# Patient Record
Sex: Male | Born: 1965 | Race: Black or African American | Hispanic: No | Smoking: Current every day smoker
Health system: Southern US, Community
[De-identification: ages and names within clinical notes are randomized; demographics above are authoritative.]

## PROBLEM LIST (undated history)

## (undated) DIAGNOSIS — E559 Vitamin D deficiency, unspecified: Secondary | ICD-10-CM

## (undated) DIAGNOSIS — J439 Emphysema, unspecified: Secondary | ICD-10-CM

## (undated) DIAGNOSIS — N400 Enlarged prostate without lower urinary tract symptoms: Secondary | ICD-10-CM

## (undated) DIAGNOSIS — Z77011 Contact with and (suspected) exposure to lead: Secondary | ICD-10-CM

## (undated) DIAGNOSIS — M5412 Radiculopathy, cervical region: Secondary | ICD-10-CM

## (undated) DIAGNOSIS — H9190 Unspecified hearing loss, unspecified ear: Secondary | ICD-10-CM

## (undated) DIAGNOSIS — J984 Other disorders of lung: Secondary | ICD-10-CM

## (undated) DIAGNOSIS — IMO0001 Reserved for inherently not codable concepts without codable children: Secondary | ICD-10-CM

## (undated) DIAGNOSIS — T7840XA Allergy, unspecified, initial encounter: Secondary | ICD-10-CM

## (undated) DIAGNOSIS — M5030 Other cervical disc degeneration, unspecified cervical region: Secondary | ICD-10-CM

## (undated) DIAGNOSIS — F17200 Nicotine dependence, unspecified, uncomplicated: Secondary | ICD-10-CM

## (undated) DIAGNOSIS — H6690 Otitis media, unspecified, unspecified ear: Secondary | ICD-10-CM

## (undated) HISTORY — DX: Contact with and (suspected) exposure to lead: Z77.011

## (undated) HISTORY — DX: Allergy, unspecified, initial encounter: T78.40XA

## (undated) HISTORY — DX: Vitamin D deficiency, unspecified: E55.9

## (undated) HISTORY — DX: Radiculopathy, cervical region: M54.12

## (undated) HISTORY — DX: Emphysema, unspecified: J43.9

## (undated) HISTORY — DX: Unspecified hearing loss, unspecified ear: H91.90

## (undated) HISTORY — DX: Otitis media, unspecified, unspecified ear: H66.90

## (undated) HISTORY — DX: Benign prostatic hyperplasia without lower urinary tract symptoms: N40.0

## (undated) HISTORY — DX: Other cervical disc degeneration, unspecified cervical region: M50.30

## (undated) HISTORY — DX: Other disorders of lung: J98.4

## (undated) HISTORY — PX: FOOT SURGERY: SHX648

## (undated) HISTORY — DX: Reserved for inherently not codable concepts without codable children: IMO0001

## (undated) HISTORY — DX: Nicotine dependence, unspecified, uncomplicated: F17.200

---

## 2004-05-13 ENCOUNTER — Encounter: Admission: RE | Admit: 2004-05-13 | Discharge: 2004-05-13 | Payer: Self-pay | Admitting: Internal Medicine

## 2005-05-21 ENCOUNTER — Ambulatory Visit (HOSPITAL_COMMUNITY): Admission: RE | Admit: 2005-05-21 | Discharge: 2005-05-21 | Payer: Self-pay | Admitting: Otolaryngology

## 2007-01-28 ENCOUNTER — Encounter: Admission: RE | Admit: 2007-01-28 | Discharge: 2007-01-28 | Payer: Self-pay | Admitting: Internal Medicine

## 2010-12-09 ENCOUNTER — Encounter: Payer: Self-pay | Admitting: Internal Medicine

## 2012-05-25 ENCOUNTER — Ambulatory Visit
Admission: RE | Admit: 2012-05-25 | Discharge: 2012-05-25 | Disposition: A | Discharge status: Home / Self Care | Payer: BC Managed Care – PPO | Source: Ambulatory Visit | Attending: Internal Medicine | Admitting: Internal Medicine

## 2012-05-25 ENCOUNTER — Other Ambulatory Visit: Payer: Self-pay | Admitting: Internal Medicine

## 2012-05-25 DIAGNOSIS — M542 Cervicalgia: Secondary | ICD-10-CM

## 2012-05-25 DIAGNOSIS — M79601 Pain in right arm: Secondary | ICD-10-CM

## 2012-06-29 ENCOUNTER — Other Ambulatory Visit: Payer: Self-pay | Admitting: Neurosurgery

## 2012-06-29 DIAGNOSIS — M5412 Radiculopathy, cervical region: Secondary | ICD-10-CM

## 2012-06-29 DIAGNOSIS — M47812 Spondylosis without myelopathy or radiculopathy, cervical region: Secondary | ICD-10-CM

## 2012-07-08 ENCOUNTER — Ambulatory Visit
Admission: RE | Admit: 2012-07-08 | Discharge: 2012-07-08 | Disposition: A | Discharge status: Home / Self Care | Payer: BC Managed Care – PPO | Source: Ambulatory Visit | Attending: Neurosurgery | Admitting: Neurosurgery

## 2012-07-08 VITALS — BP 110/75 | HR 76 | Ht 67.0 in | Wt 120.0 lb

## 2012-07-08 DIAGNOSIS — M47812 Spondylosis without myelopathy or radiculopathy, cervical region: Secondary | ICD-10-CM

## 2012-07-08 DIAGNOSIS — M5412 Radiculopathy, cervical region: Secondary | ICD-10-CM

## 2012-07-08 MED ORDER — IOHEXOL 300 MG/ML  SOLN
10.0000 mL | Freq: Once | INTRAMUSCULAR | Status: AC | PRN
  Administered 2012-07-08: 10 mL via INTRATHECAL

## 2012-07-08 MED ORDER — DIAZEPAM 5 MG PO TABS
10.0000 mg | ORAL_TABLET | Freq: Once | ORAL | Status: AC
  Administered 2012-07-08: 10 mg via ORAL

## 2012-07-19 ENCOUNTER — Ambulatory Visit
Admission: RE | Admit: 2012-07-19 | Discharge: 2012-07-19 | Disposition: A | Discharge status: Home / Self Care | Payer: BC Managed Care – PPO | Source: Ambulatory Visit | Attending: Internal Medicine | Admitting: Internal Medicine

## 2012-07-19 ENCOUNTER — Other Ambulatory Visit: Payer: Self-pay | Admitting: Internal Medicine

## 2012-09-09 ENCOUNTER — Encounter: Payer: Self-pay | Admitting: *Deleted

## 2012-09-09 DIAGNOSIS — H6690 Otitis media, unspecified, unspecified ear: Secondary | ICD-10-CM | POA: Insufficient documentation

## 2012-09-09 DIAGNOSIS — Z77011 Contact with and (suspected) exposure to lead: Secondary | ICD-10-CM | POA: Insufficient documentation

## 2012-09-09 DIAGNOSIS — M5412 Radiculopathy, cervical region: Secondary | ICD-10-CM | POA: Insufficient documentation

## 2012-09-09 DIAGNOSIS — H9190 Unspecified hearing loss, unspecified ear: Secondary | ICD-10-CM | POA: Insufficient documentation

## 2012-09-09 DIAGNOSIS — F17200 Nicotine dependence, unspecified, uncomplicated: Secondary | ICD-10-CM | POA: Insufficient documentation

## 2012-09-09 DIAGNOSIS — J984 Other disorders of lung: Secondary | ICD-10-CM | POA: Insufficient documentation

## 2012-09-09 DIAGNOSIS — T7840XA Allergy, unspecified, initial encounter: Secondary | ICD-10-CM | POA: Insufficient documentation

## 2014-03-30 ENCOUNTER — Ambulatory Visit (INDEPENDENT_AMBULATORY_CARE_PROVIDER_SITE_OTHER): Payer: BC Managed Care – PPO

## 2014-03-30 ENCOUNTER — Ambulatory Visit (INDEPENDENT_AMBULATORY_CARE_PROVIDER_SITE_OTHER): Payer: BC Managed Care – PPO | Admitting: Family Medicine

## 2014-03-30 VITALS — BP 106/66 | HR 88 | Temp 97.8°F | Resp 16 | Ht 66.0 in | Wt 114.4 lb

## 2014-03-30 DIAGNOSIS — M79675 Pain in left toe(s): Principal | ICD-10-CM

## 2014-03-30 DIAGNOSIS — M79609 Pain in unspecified limb: Secondary | ICD-10-CM

## 2014-03-30 DIAGNOSIS — M79674 Pain in right toe(s): Secondary | ICD-10-CM

## 2014-03-30 NOTE — Progress Notes (Signed)
[facility] [address], [place] [address] [phone]  Date:  03/30/2014   Name:  Jesse LakeHenry L Guthridge   DOB:  18-Feb-1966   MRN:  [id number]  PCP:  Willey BladeEAN, ERIC, MD    Chief Complaint: Feet Pain   History of Present Illness:  Jesse Wright is a 48 y.o. very pleasant male patient who presents with the following:  He is here today with "shooting pain" in his right great toe.  He has noted it for 6 months or more.  He wears steel toe boots and stands on concrete at his job so he blamed this for his sx.   He pain will come and go, but he notes it every day.   He does not have gout as far as he knows.   He has not had any injury to his feet He does notice that his big toe is crooked- this has been present for a long time   Patient Active Problem List   Diagnosis Date Noted  . Tobacco use disorder   . Hx of lead exposure   . Allergy   . Cervical radiculopathy   . Otitis media   . Hearing impairment   . Restrictive lung disease     Past Medical History  Diagnosis Date  . Allergy   . Unspecified vitamin D deficiency   . Hypertrophy of prostate without urinary obstruction and other lower urinary tract symptoms (LUTS)   . Emphysema   . Degenerative disc disease, cervical     with foraminal stenosis  . Hx of lead exposure     chronic  . Tobacco use disorder   . Cervical radiculopathy     right arm  . Otitis media   . Hearing impairment     left ear  . Restrictive lung disease     History reviewed. No pertinent past surgical history.  History  Substance Use Topics  . Smoking status: Current Every Day Smoker  . Smokeless tobacco: Not on file  . Alcohol Use: No    Family History  Problem Relation Age of Onset  . Hypertension Mother   . Hypertension Father   . Stroke Father     Allergies  Allergen Reactions  . Ketek [Telithromycin] Other (See Comments)    Patient doesn't recall ever taking this medication; has no idea reaction  . Penicillins  Other (See Comments)    "been so long ago...dizzy, lightheaded"    Medication list has been reviewed and updated.  Current Outpatient Prescriptions on File Prior to Visit  Medication Sig Dispense Refill  . cetirizine (ZYRTEC) 10 MG tablet Take 10 mg by mouth daily.       . cyclobenzaprine (FLEXERIL) 5 MG tablet Take 5 mg by mouth 3 (three) times daily as needed.      . flurbiprofen (ANSAID) 100 MG tablet Take 100 mg by mouth 2 (two) times daily.      . Hydrocodone-Acetaminophen 5-300 MG TABS Take 1 tablet by mouth as needed.       No current facility-administered medications on file prior to visit.    Review of Systems:  As per HPI- otherwise negative.   Physical Examination: Filed Vitals:   03/30/14 1327  BP: 106/66  Pulse: 88  Temp: 97.8 F (36.6 C)  Resp: 16   Filed Vitals:   03/30/14 1327  Height: 5\' 6"  (1.676 m)  Weight: 114 lb 6.4 oz (51.891 kg)   Body mass index is  18.47 kg/(m^2). Ideal Body Weight: Weight in (lb) to have BMI = 25: 154.6  GEN: WDWN, NAD, Non-toxic, A & O x 3, thin build HEENT: Atraumatic, Normocephalic. Neck supple. No masses, No LAD. Ears and Nose: No external deformity. CV: RRR, No M/G/R. No JVD. No thrill. No extra heart sounds. PULM: CTA B, no wheezes, crackles, rhonchi. No retractions. No resp. distress. No accessory muscle use. EXTR: No c/c/e NEURO Normal gait.  PSYCH: Normally interactive. Conversant. Not depressed or anxious appearing.  Calm demeanor.  Right foot: he has a large callus on the medial great toe.   Left foot: small callus on the medial great toe and on the sole of the foot at the 5th MT head.   No redness, swelling or other sx of gout.  Feet otherwise have normal shape  Pared down calluses for him- he tolerated well and felt more comfortable  [facility] reading (PRIMARY) by  Dr. Patsy Lager. Right foot: negative Left foot: negative   Assessment and Plan: Toe pain, bilateral - Plan: DG Foot 2 Views Right, DG Foot 2 Views  Left, Ambulatory referral to Podiatry  Pared calluses as above.  Will refer to podiatry for further evaluation.  Suspect his pain is due to large calluses and shoe fit issues.    Signed Abbe Amsterdam, MD

## 2014-03-30 NOTE — Patient Instructions (Signed)
We are going to refer you to podiatry to look at your feet in more detail.  Use tylenol or ibuprofen as needed for pain.   Take you CD (with your x-rays) to the podiatrist appointment.  Let us know if you need anything in the meantime

## 2014-04-05 ENCOUNTER — Ambulatory Visit (INDEPENDENT_AMBULATORY_CARE_PROVIDER_SITE_OTHER): Payer: BC Managed Care – PPO | Admitting: Podiatry

## 2014-04-05 ENCOUNTER — Encounter: Payer: Self-pay | Admitting: Podiatry

## 2014-04-05 VITALS — BP 107/60 | HR 74 | Resp 14 | Ht 67.0 in | Wt 115.0 lb

## 2014-04-05 DIAGNOSIS — L988 Other specified disorders of the skin and subcutaneous tissue: Secondary | ICD-10-CM

## 2014-04-05 DIAGNOSIS — Q828 Other specified congenital malformations of skin: Secondary | ICD-10-CM

## 2014-04-05 NOTE — Progress Notes (Signed)
   Subjective:    Patient ID: Jesse Wright, male    DOB: Dec 24, 1965, 48 y.o.   MRN: [id number]  HPI Comments: Pt states has shooting pains through both 1st toes, and right 5th MPJ. Patient states that the pain has been ongoing for over one year. He states that the pain is mostly when wearing steel toed shoes and standing on concrete for a period of time at work. The pain decreases when wearing a sneaker. He states that the pain has been increasing over the course of several months. He was seen at the urgent care center where the callus were pared down. He states that the calluses have never broken down or developed into a wound. No other complaints at this time.   Toe Pain       Review of Systems  HENT: Positive for sinus pressure.   Musculoskeletal: Positive for arthralgias.  Skin:       calluses  All other systems reviewed and are negative.      Objective:   Physical Exam  Nursing note and vitals reviewed. Constitutional: He is oriented to person, place, and time.  Musculoskeletal: Normal range of motion. He exhibits no edema.  Mild tenderness to palpation over the medial hallux bilaterally and right submetatarsal 5 head.  Decrease in the medial arch height bilaterally upon weightbearing.  Neurological: He is alert and oriented to person, place, and time.  Protective sensation intact.  Skin:  Hyperkeratotic lesions bilateral medial hallux at the level of the IPJ R>L. Hyperkeratotic lesion submetatarsal 5 right. No open lesions. No clinical signs of infection.  DP/PT pulses palpable b/l. CRT <3sec.         Assessment & Plan:  48 year old male bilateral hallux IPJ, right submetatarsal 5 painful hyperkeratotic lesions with associated pes planus. -X-rays are reviewed. There does appear to be exostosis off of the medial aspect of the hallux medially corresponding to the area of the hyperkeratotic lesions. Conservative versus surgical options discussed with the patient in  detail. -Hyperkeratotic lesions sharply debrided without complication to patient comfort.  -Foam pads dispensed to the patient as well as a pad for the right hyperkeratotic lesion. -Discussed custom versus OTC orthotics to help control the pes planus as well as to help take the pressure off the painful areas. Patient states that he gets orthotics from his company. -Patient to continue care conservative treatment at this time and will followup as needed.

## 2015-03-13 ENCOUNTER — Ambulatory Visit (INDEPENDENT_AMBULATORY_CARE_PROVIDER_SITE_OTHER): Payer: BLUE CROSS/BLUE SHIELD | Admitting: Family Medicine

## 2015-03-13 VITALS — BP 118/70 | HR 76 | Temp 98.7°F | Resp 16 | Ht 67.0 in | Wt 120.0 lb

## 2015-03-13 DIAGNOSIS — R42 Dizziness and giddiness: Secondary | ICD-10-CM

## 2015-03-13 DIAGNOSIS — H9312 Tinnitus, left ear: Secondary | ICD-10-CM

## 2015-03-13 DIAGNOSIS — J0100 Acute maxillary sinusitis, unspecified: Secondary | ICD-10-CM

## 2015-03-13 DIAGNOSIS — H8102 Meniere's disease, left ear: Secondary | ICD-10-CM | POA: Diagnosis not present

## 2015-03-13 MED ORDER — MECLIZINE HCL 25 MG PO TABS: 25.0000 mg | ORAL_TABLET | Freq: Three times a day (TID) | ORAL | Status: DC | PRN

## 2015-03-13 MED ORDER — LEVOFLOXACIN 500 MG PO TABS: 500.0000 mg | ORAL_TABLET | Freq: Every day | ORAL | Status: DC

## 2015-03-13 NOTE — Progress Notes (Signed)
° °  Subjective:  This chart was scribed for Elvina SidleKurt Lauenstein, MD by Broadus Johnawaa Al Rifaie, Medical Scribe. This patient was seen in Room 8 and the patient's care was started at 10:05 AM.   Patient ID: Jesse Wright, male    DOB: 01-27-1966, 49 y.o.   MRN: [id number]  HPI HPI Comments: Jesse Wright is a 49 y.o. male who [facility] complaining of  Dizziness, gradual onset yesterday.  Pt notes associates symptoms of vomiting, light headedness, nausea, sinus pressure, tinnitus, and ear ache. Pt states that the symptoms are more severe this morning than they were yesterday. Pt reports that he has taken some medications for the nausea, however he does not recall the name. He reports that he has experienced similar symptoms previously, and they usually are more severe during this time of the year. Pt states that he is compliant with his zyrtec medication. Pt denies dental problems.  Pt was diagnosed with Meniere's disease about 3 years ago which resulted in a loss of hearing in the left ear. Pt had multiple tests done for it. He notes that he gets his ears checked once every year, and there are no new finding with his condition.    PCP is Dr. August Saucerean  Pt works at [employer] in [place]   Review of Systems  HENT: Positive for ear pain, hearing loss, sinus pressure and tinnitus. Negative for dental problem.   Gastrointestinal: Positive for nausea and vomiting.  Neurological: Positive for light-headedness.       Objective:   Physical Exam  Constitutional: He is oriented to person, place, and time. He appears well-developed and well-nourished. No distress.  HENT:  Head: Normocephalic and atraumatic.  Right Ear: External ear normal.  Left Ear: External ear normal.  Partial upper denture  Multiple dental caries  Eyes: EOM are normal. Pupils are equal, round, and reactive to light.  Neck: Neck supple.  Cardiovascular: Normal rate, regular rhythm and normal heart sounds.   Exam reveals no gallop and no friction rub.   No murmur heard. Pulmonary/Chest: Effort normal and breath sounds normal. No respiratory distress. He has no wheezes. He has no rales.  Musculoskeletal:  No bruise in neck   Neurological: He is alert and oriented to person, place, and time. No cranial nerve deficit.  Skin: Skin is warm and dry.  Psychiatric: He has a normal mood and affect. His behavior is normal.  Nursing note and vitals reviewed.  BP 118/70 mmHg   Pulse 76   Temp(Src) 98.7 F (37.1 C) (Oral)   Resp 16   Ht 5\' 7"  (1.702 m)   Wt 120 lb (54.432 kg)   BMI 18.79 kg/m2   SpO2 98%     Assessment & Plan:   This chart was scribed in my presence and reviewed by me personally.    ICD-9-CM ICD-10-CM   1. Vertigo 780.4 R42 meclizine (ANTIVERT) 25 MG tablet  2. Tinnitus, left 388.30 H93.12 meclizine (ANTIVERT) 25 MG tablet  3. Meniere disease, left 386.00 H81.02 meclizine (ANTIVERT) 25 MG tablet  4. Subacute maxillary sinusitis 461.0 J01.00 levofloxacin (LEVAQUIN) 500 MG tablet     Signed, Elvina SidleKurt Lauenstein, MD

## 2015-03-13 NOTE — Patient Instructions (Signed)
I would like you to follow-up with your primary care physician, Dr. Willey BladeEric Dean, as soon as he can get you in to his schedule  Meniere Disease Meniere disease is an inner ear disorder. It causes attacks of a spinning sensation (vertigo) and ringing in the ear (tinnitus). It also causes hearing loss and a sensation of fullness or pressure in your ear.  Meniere disease is lifelong. It may get worse over time. Symptoms usually begin in one ear but may eventually affect both ears.  CAUSES Meniere disease is caused by having too much of the fluid that is in your inner ear (endolymph). When endolymph builds up in your inner ear, it affects the nerves that control balance and hearing. The reason for the endolymph buildup is not known. Possible causes include:  Allergy.  An abnormal reaction of the body's defense system (autoimmune disease).  Viral infection of the inner ear.  Head injury. RISK FACTORS  Age older than 40 years.  Family history of Meniere disease.  History of autoimmune disease.  History of migraine headaches. SIGNS AND SYMPTOMS Symptoms of Meniere disease can come and go and may last for up to 4 hours at a time. Symptoms usually start in one ear and may become more frequent and eventually involve both ears. Symptoms can include:  Fullness and pressure in your ear.  Roaring or ringing in your ear.  Vertigo and loss of balance.  Decreased hearing.  Nausea and vomiting. DIAGNOSIS Your health care provider will perform a physical exam. Tests may be done to confirm a diagnosis of Meniere disease. These tests may include:  A hearing test (audiogram).  An electronystagmogram. This tests your balance nerve (vestibular nerve).  Imaging studies, such as CT or MRI, of your inner ear. TREATMENT There is no cure for Meniere disease, but it can be managed. Management may include:  A diet that may help relieve symptoms of Meniere disease.  Use of medicines to  reduce:  Vertigo.  Nausea.  Fluid retention.  Use of an air pressure pulse generator. This is a machine that sends small pressure pulses into your ear canal.  Inner ear surgery (rare). When you experience symptoms, it can be helpful to lie down on a flat surface and focus your eyes on one object that does not move. Try to stay in that position until your symptoms go away.  HOME CARE INSTRUCTIONS   Take medicines only as directed by your health care provider.  Eat the same amount of food at the same time every day, including snacks.  Do not skip meals.  Limit the salt in your diet to 1,000 mg a day.  Avoid caffeine.  Limit alcoholic drinks to one drink a day.  Do not eat foods containing monosodium glutamate (MSG).  Drink enough fluids to keep your urine clear or pale yellow.  Do not use any tobacco products including cigarettes, chewing tobacco, or electronic cigarettes. If you need help quitting, ask your health care provider.  Find ways to reduce or avoid stress. SEEK MEDICAL CARE IF:   You have symptoms that last longer than 4 hours.  You have new or more severe symptoms. SEEK IMMEDIATE MEDICAL CARE IF:   You have been vomiting for 24 hours.  You are not able to keep fluids down.  You have chest pain or trouble breathing. Document Released: 08/08/2000 Document Revised: 12/26/2013 Document Reviewed: 07/25/2013 [facility] Patient Information 2015 [employer] [place]. This information is not intended to replace advice given to you  by your health care provider. Make sure you discuss any questions you have with your health care provider.  

## 2015-11-09 ENCOUNTER — Emergency Department (HOSPITAL_COMMUNITY)
Admission: EM | Admit: 2015-11-09 | Discharge: 2015-11-09 | Disposition: A | Discharge status: Home / Self Care | Payer: BLUE CROSS/BLUE SHIELD | Attending: Emergency Medicine | Admitting: Emergency Medicine

## 2015-11-09 ENCOUNTER — Encounter (HOSPITAL_COMMUNITY): Payer: Self-pay | Admitting: *Deleted

## 2015-11-09 ENCOUNTER — Emergency Department (HOSPITAL_COMMUNITY): Payer: BLUE CROSS/BLUE SHIELD

## 2015-11-09 DIAGNOSIS — Z8709 Personal history of other diseases of the respiratory system: Secondary | ICD-10-CM | POA: Insufficient documentation

## 2015-11-09 DIAGNOSIS — Z8639 Personal history of other endocrine, nutritional and metabolic disease: Secondary | ICD-10-CM | POA: Insufficient documentation

## 2015-11-09 DIAGNOSIS — F17200 Nicotine dependence, unspecified, uncomplicated: Secondary | ICD-10-CM | POA: Diagnosis not present

## 2015-11-09 DIAGNOSIS — Z880 Allergy status to penicillin: Secondary | ICD-10-CM | POA: Diagnosis not present

## 2015-11-09 DIAGNOSIS — Z8739 Personal history of other diseases of the musculoskeletal system and connective tissue: Secondary | ICD-10-CM | POA: Diagnosis not present

## 2015-11-09 DIAGNOSIS — N23 Unspecified renal colic: Secondary | ICD-10-CM

## 2015-11-09 DIAGNOSIS — R109 Unspecified abdominal pain: Secondary | ICD-10-CM | POA: Diagnosis present

## 2015-11-09 LAB — COMPREHENSIVE METABOLIC PANEL
ALK PHOS: 63 U/L (ref 38–126)
ALT: 16 U/L — AB (ref 17–63)
AST: 22 U/L (ref 15–41)
Albumin: 3.4 g/dL — ABNORMAL LOW (ref 3.5–5.0)
Anion gap: 11 (ref 5–15)
BUN: <5 mg/dL — ABNORMAL LOW (ref 6–20)
CALCIUM: 8.7 mg/dL — AB (ref 8.9–10.3)
CO2: 25 mmol/L (ref 22–32)
CREATININE: 0.86 mg/dL (ref 0.61–1.24)
Chloride: 106 mmol/L (ref 101–111)
Glucose, Bld: 115 mg/dL — ABNORMAL HIGH (ref 65–99)
Potassium: 3.5 mmol/L (ref 3.5–5.1)
Sodium: 142 mmol/L (ref 135–145)
Total Bilirubin: 0.3 mg/dL (ref 0.3–1.2)
Total Protein: 6.1 g/dL — ABNORMAL LOW (ref 6.5–8.1)

## 2015-11-09 LAB — CBC
HCT: 39.3 % (ref 39.0–52.0)
Hemoglobin: 13.3 g/dL (ref 13.0–17.0)
MCH: 29 pg (ref 26.0–34.0)
MCHC: 33.8 g/dL (ref 30.0–36.0)
MCV: 85.6 fL (ref 78.0–100.0)
Platelets: 404 10*3/uL — ABNORMAL HIGH (ref 150–400)
RBC: 4.59 MIL/uL (ref 4.22–5.81)
RDW: 13.5 % (ref 11.5–15.5)
WBC: 8.4 10*3/uL (ref 4.0–10.5)

## 2015-11-09 LAB — URINE MICROSCOPIC-ADD ON

## 2015-11-09 LAB — URINALYSIS, ROUTINE W REFLEX MICROSCOPIC
BILIRUBIN URINE: NEGATIVE
GLUCOSE, UA: NEGATIVE mg/dL
KETONES UR: NEGATIVE mg/dL
Leukocytes, UA: NEGATIVE
NITRITE: NEGATIVE
PH: 7.5 (ref 5.0–8.0)
Protein, ur: NEGATIVE mg/dL
SPECIFIC GRAVITY, URINE: 1.012 (ref 1.005–1.030)

## 2015-11-09 LAB — LIPASE, BLOOD: LIPASE: 22 U/L (ref 11–51)

## 2015-11-09 MED ORDER — MORPHINE SULFATE (PF) 4 MG/ML IV SOLN
4.0000 mg | Freq: Once | INTRAVENOUS | Status: DC
  Filled 2015-11-09: qty 1

## 2015-11-09 MED ORDER — ONDANSETRON HCL 4 MG/2ML IJ SOLN
4.0000 mg | Freq: Once | INTRAMUSCULAR | Status: DC
  Filled 2015-11-09: qty 2

## 2015-11-09 MED ORDER — KETOROLAC TROMETHAMINE 30 MG/ML IJ SOLN
30.0000 mg | Freq: Once | INTRAMUSCULAR | Status: DC
  Filled 2015-11-09: qty 1

## 2015-11-09 NOTE — ED Provider Notes (Signed)
CSN: [id number]     Arrival date & time 11/09/15  1006 History   First MD Initiated Contact with Patient 11/09/15 1549     Chief Complaint  Patient presents with  . Abdominal Pain     (Consider location/radiation/quality/duration/timing/severity/associated sxs/prior Treatment) HPI Patient presents with acute onset right flank pain radiating into the right side of his abdomen. This occurred while trying to have a bowel movement. States he had mild amount of nausea. No vomiting. No previously similar symptoms. States the pain has significantly improved since onset. Denies fever or chills. No urinary symptoms including gross hematuria or dysuria. No history of previous kidney stones.  Past Medical History  Diagnosis Date  . Allergy   . Unspecified vitamin D deficiency   . Hypertrophy of prostate without urinary obstruction and other lower urinary tract symptoms (LUTS)   . Emphysema   . Degenerative disc disease, cervical     with foraminal stenosis  . Hx of lead exposure     chronic  . Tobacco use disorder   . Cervical radiculopathy     right arm  . Otitis media   . Hearing impairment     left ear  . Restrictive lung disease    History reviewed. No pertinent past surgical history. Family History  Problem Relation Age of Onset  . Hypertension Mother   . Hypertension Father   . Stroke Father    Social History  Substance Use Topics  . Smoking status: Current Every Day Smoker  . Smokeless tobacco: None  . Alcohol Use: No    Review of Systems  Constitutional: Negative for fever and chills.  Respiratory: Negative for shortness of breath.   Cardiovascular: Negative for chest pain.  Gastrointestinal: Positive for nausea and abdominal pain. Negative for vomiting, diarrhea and constipation.  Genitourinary: Positive for flank pain. Negative for hematuria.  Musculoskeletal: Negative for myalgias, back pain, neck pain and neck stiffness.  Skin: Negative for rash and wound.   Neurological: Negative for dizziness, weakness, light-headedness, numbness and headaches.  All other systems reviewed and are negative.     Allergies  Ketek and Penicillins  Home Medications   Prior to Admission medications   Medication Sig Start Date End Date Taking? Authorizing Provider  guaiFENesin (MUCINEX) 600 MG 12 hr tablet Take 600 mg by mouth 2 (two) times daily as needed for cough or to loosen phlegm.   Yes Historical Provider, MD  ibuprofen (ADVIL,MOTRIN) 800 MG tablet Take 400 mg by mouth every 8 (eight) hours as needed for moderate pain.   Yes Historical Provider, MD  levofloxacin (LEVAQUIN) 500 MG tablet Take 1 tablet (500 mg total) by mouth daily. Patient not taking: Reported on 11/09/2015 03/13/15   Elvina Sidle, MD  meclizine (ANTIVERT) 25 MG tablet Take 1 tablet (25 mg total) by mouth 3 (three) times daily as needed for dizziness. Patient not taking: Reported on 11/09/2015 03/13/15   Elvina Sidle, MD   BP 121/68 mmHg  Pulse 56  Temp(Src) 97.7 F (36.5 C) (Oral)  Resp 18  SpO2 100% Physical Exam  Constitutional: He is oriented to person, place, and time. He appears well-developed and well-nourished. No distress.  HENT:  Head: Normocephalic and atraumatic.  Mouth/Throat: Oropharynx is clear and moist.  Eyes: EOM are normal. Pupils are equal, round, and reactive to light.  Neck: Normal range of motion. Neck supple.  Cardiovascular: Normal rate and regular rhythm.   Pulmonary/Chest: Effort normal and breath sounds normal. No respiratory distress. He has no  wheezes. He has no rales. He exhibits no tenderness.  Abdominal: Soft. Bowel sounds are normal. He exhibits no distension and no mass. There is no tenderness. There is no rebound and no guarding.  Musculoskeletal: Normal range of motion. He exhibits no edema or tenderness.  No CVA tenderness with percussion.  Neurological: He is alert and oriented to person, place, and time.  Skin: Skin is warm and dry. No  rash noted. No erythema.  Psychiatric: He has a normal mood and affect. His behavior is normal.  Nursing note and vitals reviewed.   ED Course  Procedures (including critical care time) Labs Review Labs Reviewed  COMPREHENSIVE METABOLIC PANEL - Abnormal; Notable for the following:    Glucose, Bld 115 (*)    BUN <5 (*)    Calcium 8.7 (*)    Total Protein 6.1 (*)    Albumin 3.4 (*)    ALT 16 (*)    All other components within normal limits  CBC - Abnormal; Notable for the following:    Platelets 404 (*)    All other components within normal limits  URINALYSIS, ROUTINE W REFLEX MICROSCOPIC (NOT AT [facility]) - Abnormal; Notable for the following:    Hgb urine dipstick MODERATE (*)    All other components within normal limits  URINE MICROSCOPIC-ADD ON - Abnormal; Notable for the following:    Squamous Epithelial / LPF 0-5 (*)    Bacteria, UA RARE (*)    Casts GRANULAR CAST (*)    All other components within normal limits  LIPASE, BLOOD    Imaging Review Ct Renal Stone Study  11/09/2015  CLINICAL DATA:  Right flank pain after a bowel movement this morning. EXAM: CT ABDOMEN AND PELVIS WITHOUT CONTRAST TECHNIQUE: Multidetector CT imaging of the abdomen and pelvis was performed following the standard protocol without IV contrast. COMPARISON:  None. FINDINGS: Lower chest:  Lung bases are clear.  No pleural effusions. Hepatobiliary: Normal appearance of the liver and gallbladder. Pancreas: Normal appearance of the pancreas without inflammation or duct dilatation. Spleen: Normal appearance of spleen without enlargement. Adrenals/Urinary Tract: 1.4 cm low-density nodule in the right adrenal gland is suggestive for a benign adenoma. Normal left adrenal gland. Normal appearance of the left kidney without stones. There is a 3 mm stone in the right kidney lower pole without hydronephrosis. No evidence for a ureter stone. Normal appearance of the urinary bladder. Stomach/Bowel: Appendix is partially  visualized in the right lower quadrant without inflammatory changes. No acute abnormality to the stomach, small bowel or colon. No evidence for bowel obstruction. Vascular/Lymphatic: No significant lymphadenopathy. Negative for an aortic aneurysm. Reproductive: Prostate is prominent measuring 5.0 cm in transverse dimension Other: No free fluid.  No free air. Musculoskeletal:  No suspicious bone lesions identified. IMPRESSION: Nonobstructive right kidney stone. Right adrenal adenoma. Prominent prostate. Electronically Signed   By: Richarda OverlieAdam  Henn M.D.   On: 11/09/2015 17:41   I have personally reviewed and evaluated these images and lab results as part of my medical decision-making.   EKG Interpretation None      MDM   Final diagnoses:  Renal colic on right side    Patient very well-appearing. States the pain is significantly improved. His refusing further pain medication. CT renal stone study given hematuria on UA. CT with nonobstructive right renal stone. Question previously passing stone. Continues to be asymptomatic. We'll discharge home with return precautions.  Loren Raceravid Lun Muro, MD 11/09/15 1806

## 2015-11-09 NOTE — ED Notes (Signed)
Pt reports having bowel movement this am and then onset of right side abd/flank pain. Denies difficulty urinating. No acute distress noted at triage.

## 2015-11-09 NOTE — Discharge Instructions (Signed)
Kidney Stones  Kidney stones (urolithiasis) are solid masses that form inside your kidneys. The intense pain is caused by the stone moving through the kidney, ureter, bladder, and urethra (urinary tract). When the stone moves, the ureter starts to spasm around the stone. The stone is usually passed in your pee (urine).   HOME CARE  · Drink enough fluids to keep your pee clear or pale yellow. This helps to get the stone out.  · Take a 24-hour pee (urine) sample as told by your doctor. You may need to take another sample every 6-12 months.  · Strain all pee through the provided strainer. Do not pee without peeing through the strainer, not even once. If you pee the stone out, catch it in the strainer. The stone may be as small as a grain of salt. Take this to your doctor. This will help your doctor figure out what you can do to try to prevent more kidney stones.  · Only take medicine as told by your doctor.  · Make changes to your daily diet as told by your doctor. You may be told to:    Limit how much salt you eat.    Eat 5 or more servings of fruits and vegetables each day.    Limit how much meat, poultry, fish, and eggs you eat.  · Keep all follow-up visits as told by your doctor. This is important.  · Get follow-up X-rays as told by your doctor.  GET HELP IF:  You have pain that gets worse even if you have been taking pain medicine.  GET HELP RIGHT AWAY IF:   · Your pain does not get better with medicine.  · You have a fever or shaking chills.  · Your pain increases and gets worse over 18 hours.  · You have new belly (abdominal) pain.  · You feel faint or pass out.  · You are unable to pee.     This information is not intended to replace advice given to you by your health care provider. Make sure you discuss any questions you have with your health care provider.     Document Released: 01/28/2008 Document Revised: 05/02/2015 Document Reviewed: 01/12/2013  Elsevier Interactive Patient Education ©2016 [employer]

## 2016-02-12 DIAGNOSIS — I824Z1 Acute embolism and thrombosis of unspecified deep veins of right distal lower extremity: Secondary | ICD-10-CM | POA: Insufficient documentation

## 2016-03-06 DIAGNOSIS — Z7901 Long term (current) use of anticoagulants: Secondary | ICD-10-CM | POA: Insufficient documentation

## 2016-05-29 DIAGNOSIS — S92901A Unspecified fracture of right foot, initial encounter for closed fracture: Secondary | ICD-10-CM | POA: Insufficient documentation

## 2016-11-04 ENCOUNTER — Encounter (INDEPENDENT_AMBULATORY_CARE_PROVIDER_SITE_OTHER): Payer: Self-pay

## 2016-11-04 ENCOUNTER — Encounter (HOSPITAL_COMMUNITY): Payer: Self-pay | Admitting: Psychology

## 2016-11-04 ENCOUNTER — Ambulatory Visit (INDEPENDENT_AMBULATORY_CARE_PROVIDER_SITE_OTHER): Payer: BLUE CROSS/BLUE SHIELD | Admitting: Psychology

## 2016-11-04 DIAGNOSIS — F1210 Cannabis abuse, uncomplicated: Secondary | ICD-10-CM | POA: Diagnosis not present

## 2016-11-04 NOTE — Progress Notes (Signed)
CCA: The patient is a 50 yo married, black, male seeking to complete an assessment per instructions from his employer. The patient was injured on the job on Jan 23, 2016. He suffered a severe injury to his right foot and all five toes and part of his foot were crushed. He has had surgeries and nine months after the injury, he continues to receive treatment from orthopedic and other specialists in order to regain ability to walk without cane, crutches or boot. The patient reported he smoked a little cannabis in his youth, but he has been employed by [employer] for 22 years. His job description includes constructing batteries and because of the potential problems associated with toxic chemicals, etc., the patient has his blood drawn monthly. He decided when he was first hired, he would have to give up any illegal drug use or risk losing his job. The patient reported he remained abstinent for years, but in the spring of 2017, his father became quite ill. His oldest son, who is a quadriplegic (paralyzed 10 years ago due to a gunshot), suffered two serious setback at the same time and it proved to be a very stressful time for him. The patient reported he began smoking a couple of times a week. On the job over the [employer], a holiday where he would get almost triple his regular pay, the patient was seeking more work and used a machine that he had little experience with. The forklift crushed his right foot. His father died the week after his accident. The patient has had surgeries and continues to receive treatment. He met me today in a boot and walks with a limp. He admitted he still hurts and is going for physical therapy in an effort to allow him to put his foot in a shoe that is being specially made for him. The patient reported his work wants him back, but have required him to complete a SA assessment.  The patient reportred he and his wife have continued to struggle financially because he  hadn't received a pay check until January of this year, when he was approved for [employer]. Prior to this, they had lived off her small income and came close to 'losing everything'. The patient reported he last smoked marijuana in early January. He has remained abstinent and intends to remain that way. He also understands his employer will be conducting regular drug tests. It appears as if in the late spring of 2017 this patient struggled with significant stress and angst due to his father's failing health and severe health complications suffered by his 53 yo quadriplegic son ("We almost lost him twice"). These stressors were more than he could manage and contributed to his sporadic use of cannabis. He was injured on work and a blood test was positive for THC. He has been out of work since the injury and is still unable to work at this time. However, he is preparing to return and his employer wants him back. Our recommendation is that this patient be randomly drug-tested and close monitoring continued indefinitely.

## 2017-03-17 DIAGNOSIS — S92321K Displaced fracture of second metatarsal bone, right foot, subsequent encounter for fracture with nonunion: Secondary | ICD-10-CM | POA: Insufficient documentation

## 2017-03-17 DIAGNOSIS — M2011 Hallux valgus (acquired), right foot: Secondary | ICD-10-CM | POA: Insufficient documentation

## 2017-05-26 ENCOUNTER — Telehealth: Payer: Self-pay | Admitting: Podiatry

## 2017-05-26 NOTE — Telephone Encounter (Signed)
I was calling to check on the status of the urgent request for pt's medical records. I faxed the request last Thursday 21 May 2017 for any and all records from your office. If you could call me back, my direct number is [phone] or if you have the information which is a letter and the signe HIPPA you can fax those to me at [phone]. We need these records as soon as possible for a motion.

## 2019-11-24 ENCOUNTER — Ambulatory Visit: Payer: BC Managed Care – PPO | Attending: Internal Medicine

## 2019-11-24 DIAGNOSIS — Z23 Encounter for immunization: Secondary | ICD-10-CM

## 2019-11-24 NOTE — Progress Notes (Signed)
   Covid-19 Vaccination Clinic  Name:  Jesse Wright    MRN: [id number] DOB: [birth year]  11/24/2019  Mr. Jesse Wright was observed post Covid-19 immunization for 15 minutes without incident. He was provided with Vaccine Information Sheet and instruction to access the V-Safe system.   Mr. Jesse Wright was instructed to call 911 with any severe reactions post vaccine: Marland Kitchen Difficulty breathing  . Swelling of face and throat  . A fast heartbeat  . A bad rash all over body  . Dizziness and weakness   Immunizations Administered    Name Date Dose VIS Date Route   Pfizer COVID-19 Vaccine 11/24/2019  3:00 PM 0.3 mL 08/05/2019 Intramuscular   Manufacturer: [employer], [employer]   Lot: [id number]   NDC: [address]-8    pt reported no symptoms post vaccine

## 2019-12-19 ENCOUNTER — Ambulatory Visit: Payer: BC Managed Care – PPO | Attending: Internal Medicine

## 2019-12-19 DIAGNOSIS — Z23 Encounter for immunization: Secondary | ICD-10-CM

## 2019-12-19 NOTE — Progress Notes (Signed)
   Covid-19 Vaccination Clinic  Name:  Jesse Wright    MRN: [id number] DOB: 09/09/1965  12/19/2019  [name] was observed post Covid-19 immunization for 15 minutes without incident. He was provided with Vaccine Information Sheet and instruction to access the V-Safe system.   [name] was instructed to call 911 with any severe reactions post vaccine: Marland Kitchen Difficulty breathing  . Swelling of face and throat  . A fast heartbeat  . A bad rash all over body  . Dizziness and weakness   Immunizations Administered    Name Date Dose VIS Date Route   Pfizer COVID-19 Vaccine 12/19/2019  3:02 PM 0.3 mL 10/19/2018 Intramuscular   Manufacturer: [employer], [employer]   Lot: [id number]   NDC: [address]-7

## 2021-07-25 ENCOUNTER — Ambulatory Visit (INDEPENDENT_AMBULATORY_CARE_PROVIDER_SITE_OTHER): Payer: BC Managed Care – PPO

## 2021-07-25 ENCOUNTER — Other Ambulatory Visit: Payer: Self-pay

## 2021-07-25 ENCOUNTER — Ambulatory Visit
Admission: EM | Admit: 2021-07-25 | Discharge: 2021-07-25 | Disposition: A | Discharge status: Home / Self Care | Payer: BC Managed Care – PPO | Attending: Physician Assistant | Admitting: Physician Assistant

## 2021-07-25 DIAGNOSIS — R109 Unspecified abdominal pain: Secondary | ICD-10-CM

## 2021-07-25 DIAGNOSIS — K5900 Constipation, unspecified: Secondary | ICD-10-CM | POA: Diagnosis not present

## 2021-07-25 MED ORDER — DICYCLOMINE HCL 20 MG PO TABS: 20.0000 mg | ORAL_TABLET | Freq: Two times a day (BID) | ORAL | 0 refills | Status: DC

## 2021-07-25 NOTE — ED Provider Notes (Signed)
[facility]    CSN: [id number] Arrival date & time: 07/25/21  6073      History   Chief Complaint Chief Complaint  Patient presents with   Abdominal Pain    HPI Jesse Wright is a 55 y.o. male.   Patient here today for evaluation of lower abdominal pain that has been present for the last several months. He reports he has been dealing with constipation as well- states he was able to have bm this morning. He denies any blood in his stool or dark tarry stools. He states he has significant cramping in his abdomen at night but then does not produce BM. He has not had fever. He was seeing PCP but has not been in so long he was told he would need to re-establish care.   The history is provided by the patient.  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 No pertinent surgical history.     Home Medications    Prior to Admission medications   Medication Sig Start Date End Date Taking? Authorizing Provider  dicyclomine (BENTYL) 20 MG tablet Take 1 tablet (20 mg total) by mouth 2 (two) times daily. 07/25/21  Yes Tomi Bamberger, PA-C  ibuprofen (ADVIL,MOTRIN)  800 MG tablet Take 400 mg by mouth every 8 (eight) hours as needed for moderate pain.    [provider]    Family History Family History  Problem Relation Age of Onset   Hypertension Mother    Hypertension Father    Stroke Father     Social History Social History   Tobacco Use   Smoking status: Every Day    Packs/day: 0.50    Years: 23.00    Pack years: 11.50    Types: Cigarettes   Smokeless tobacco: Never  Substance Use Topics   Alcohol use: Not Currently   Drug use: Not Currently    Types: Marijuana    Comment: patient's use was sporadic - last use 08/28/16     Allergies   Ketek [telithromycin] and Penicillins   Review of Systems Review of Systems  Constitutional:  Negative for chills and fever.  Eyes:  Negative for discharge and redness.  Respiratory:  Negative for shortness of breath.   Gastrointestinal:  Positive for abdominal pain and constipation. Negative for blood in stool, diarrhea, nausea and vomiting.    Physical Exam Triage Vital Signs ED Triage Vitals [07/25/21 1011]  Enc Vitals Group     BP (!) 151/94  Pulse Rate 87     Resp 18     Temp 98.1 F (36.7 C)     Temp Source Oral     SpO2 98 %     Weight      Height      Head Circumference      Peak Flow      Pain Score 0     Pain Loc      Pain Edu?      Excl. in GC?    No data found.  Updated Vital Signs BP (!) 151/94 (BP Location: Left Arm)   Pulse 87   Temp 98.1 F (36.7 C) (Oral)   Resp 18   SpO2 98%      Physical Exam Vitals and nursing note reviewed.  Constitutional:      General: He is not in acute distress.    Appearance: Normal appearance. He is not ill-appearing.  HENT:     Head: Normocephalic and atraumatic.  Eyes:     Conjunctiva/sclera: Conjunctivae normal.  Cardiovascular:     Rate and Rhythm: Normal rate and regular rhythm.     Heart sounds: Normal heart sounds. No murmur heard. Pulmonary:     Effort: Pulmonary effort is normal. No respiratory  distress.     Breath sounds: Normal breath sounds. No wheezing, rhonchi or rales.  Abdominal:     General: Abdomen is flat. Bowel sounds are normal. There is no distension.     Palpations: Abdomen is soft.     Tenderness: There is no abdominal tenderness. There is no guarding or rebound.  Skin:    General: Skin is warm and dry.  Neurological:     Mental Status: He is alert.  Psychiatric:        Mood and Affect: Mood normal.        Behavior: Behavior normal.     UC Treatments / Results  Labs (all labs ordered are listed, but only abnormal results are displayed) Labs Reviewed - No data to display  EKG   Radiology DG Abd 2 Views  Result Date: 07/25/2021 CLINICAL DATA:  Abdominal pain EXAM: ABDOMEN - 2 VIEW COMPARISON:  CT 11/09/2015 FINDINGS: Nonobstructive bowel gas pattern. No evidence of free air. Mild colonic stool burden. No acute osseous abnormality. IMPRESSION: No evidence of bowel obstruction.  Mild colonic stool burden. Electronically Signed   By: Caprice Renshaw M.D.   On: 07/25/2021 11:12    Procedures Procedures (including critical care time)  Medications Ordered in UC Medications - No data to display  Initial Impression / Assessment and Plan / UC Course  I have reviewed the triage vital signs and the nursing notes.  Pertinent labs & imaging results that were available during my care of the patient were reviewed by me and considered in my medical decision making (see chart for details).    Will trial bentyl for treatment of cramping and recommend further evaluation by PCP. Advised evaluation in the ED with any worsening symptoms in the meantime.   Final Clinical Impressions(s) / UC Diagnoses   Final diagnoses:  Abdominal pain, unspecified abdominal location  Constipation, unspecified constipation type   Discharge Instructions   None    ED Prescriptions     Medication Sig Dispense Auth. Provider   dicyclomine (BENTYL) 20 MG tablet Take 1 tablet (20 mg  total) by mouth 2 (two) times daily. 20 tablet Tomi Bamberger, PA-C      PDMP not reviewed this encounter.  Tomi Bamberger, PA-C 07/25/21 1526

## 2021-07-25 NOTE — ED Triage Notes (Signed)
Pt c/o abdominal pain off and on with constipation for a few months. States took a laxative on Tuesday and went a little today.

## 2021-09-05 ENCOUNTER — Encounter: Payer: Self-pay | Admitting: Family Medicine

## 2021-09-05 ENCOUNTER — Ambulatory Visit (INDEPENDENT_AMBULATORY_CARE_PROVIDER_SITE_OTHER): Payer: BC Managed Care – PPO | Admitting: Family Medicine

## 2021-09-05 ENCOUNTER — Other Ambulatory Visit: Payer: Self-pay

## 2021-09-05 VITALS — BP 150/84 | HR 72 | Temp 97.6°F | Resp 16 | Ht 67.0 in | Wt 120.8 lb

## 2021-09-05 DIAGNOSIS — Z23 Encounter for immunization: Secondary | ICD-10-CM | POA: Diagnosis not present

## 2021-09-05 DIAGNOSIS — Z1211 Encounter for screening for malignant neoplasm of colon: Secondary | ICD-10-CM

## 2021-09-05 DIAGNOSIS — I10 Essential (primary) hypertension: Secondary | ICD-10-CM

## 2021-09-05 DIAGNOSIS — Z7689 Persons encountering health services in other specified circumstances: Secondary | ICD-10-CM

## 2021-09-05 MED ORDER — DICYCLOMINE HCL 20 MG PO TABS: 20.0000 mg | ORAL_TABLET | Freq: Two times a day (BID) | ORAL | 1 refills | Status: DC

## 2021-09-05 MED ORDER — LISINOPRIL 10 MG PO TABS: 10.0000 mg | ORAL_TABLET | Freq: Every day | ORAL | 0 refills | Status: DC

## 2021-09-05 NOTE — Progress Notes (Signed)
Patient is here to establish care with provide. Patient has no new concerns today

## 2021-09-07 NOTE — Progress Notes (Signed)
New Patient Office Visit  Subjective:  Patient ID: Jesse Wright, male    DOB: 1966/05/22  Age: 56 y.o. MRN: [id number]  CC:  Chief Complaint  Patient presents with   Establish Care    HPI Jesse Wright presents for to establish care. Patient denies acute complaints or concerns.   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   Hypertrophy of prostate without urinary obstruction and other lower urinary tract symptoms (LUTS)    Otitis media    Restrictive lung disease    Tobacco use disorder    Unspecified vitamin D deficiency     Past Surgical History:  Procedure Laterality Date   FOOT SURGERY      Family History  Problem Relation Age of Onset   Hypertension Mother    Hypertension Father    Stroke Father     Social History   Socioeconomic History   Marital status: Widowed    Spouse name: Not on file   Number of children: Not on file   Years of education: Not on file   Highest education level: Not on file  Occupational History   Not on file  Tobacco Use   Smoking status: Every Day    Packs/day: 0.50    Years: 23.00    Pack years: 11.50    Types: Cigarettes   Smokeless tobacco: Never  Substance and Sexual Activity   Alcohol use: Not Currently   Drug use: Not Currently    Types: Marijuana    Comment: patient's use was sporadic - last use 08/28/16   Sexual activity: Not Currently  Other Topics Concern   Not on file  Social History Narrative   Not on file   Social Determinants of Health   Financial Resource Strain: Not on file  Food Insecurity: Not on file  Transportation Needs: Not on file  Physical Activity: Not on file  Stress: Not on file  Social Connections: Not on file  Intimate Partner Violence: Not on file    ROS Review of Systems  Cardiovascular:  Negative.   Gastrointestinal: Negative.   All other systems reviewed and are negative.  Objective:   Today's Vitals: BP (!) 150/84    Pulse 72    Temp 97.6 F (36.4 C) (Oral)    Resp 16    Ht 5\' 7"  (1.702 m)    Wt 120 lb 12.8 oz (54.8 kg)    BMI 18.92 kg/m   Physical Exam Vitals and nursing note reviewed.  Constitutional:      General: He is not in acute distress. Cardiovascular:     Rate and Rhythm: Normal rate and regular rhythm.  Pulmonary:     Effort: Pulmonary effort is normal.     Breath sounds: Normal breath sounds.  Abdominal:     Palpations: Abdomen is soft.     Tenderness: There is no abdominal tenderness.  Musculoskeletal:     Right lower leg: No edema.     Left lower leg: No edema.  Neurological:     General: No focal deficit present.     Mental Status: He is alert and oriented to person, place, and time.    Assessment & Plan:   1. Essential  hypertension Elevated reading. Patient prescribed lisinopril 10 mg daily. Will monitor  2. Screening for colon cancer Referral for colonoscopy  - Ambulatory referral to Gastroenterology  3. Encounter to establish care     Outpatient Encounter Medications as of 09/05/2021  Medication Sig   lisinopril (ZESTRIL) 10 MG tablet Take 1 tablet (10 mg total) by mouth daily.   dicyclomine (BENTYL) 20 MG tablet Take 1 tablet (20 mg total) by mouth 2 (two) times daily.   ibuprofen (ADVIL,MOTRIN) 800 MG tablet Take 400 mg by mouth every 8 (eight) hours as needed for moderate pain. (Patient not taking: Reported on 09/05/2021)   [DISCONTINUED] dicyclomine (BENTYL) 20 MG tablet Take 1 tablet (20 mg total) by mouth 2 (two) times daily.   No facility-administered encounter medications on file as of 09/05/2021.    Follow-up: Return in about 2 weeks (around 09/19/2021) for follow up, chronic med issues.   Tommie Raymond, MD

## 2021-10-15 ENCOUNTER — Encounter: Payer: Self-pay | Admitting: Family Medicine

## 2021-10-17 ENCOUNTER — Other Ambulatory Visit: Payer: Self-pay | Admitting: *Deleted

## 2021-10-17 ENCOUNTER — Telehealth: Payer: Self-pay | Admitting: Family Medicine

## 2021-10-17 MED ORDER — DICYCLOMINE HCL 20 MG PO TABS: 20.0000 mg | ORAL_TABLET | Freq: Two times a day (BID) | ORAL | 1 refills | Status: DC

## 2021-10-17 NOTE — Telephone Encounter (Signed)
dicyclomine (BENTYL) 20 MG tablet [[id number]]   Pharmacy  [id number] [place], [place] - [address]  [address] [place] [address]  Phone:  [phone]  Fax:  [phone]  DEA #:  [id number]

## 2021-10-23 ENCOUNTER — Ambulatory Visit: Payer: BC Managed Care – PPO

## 2021-10-23 ENCOUNTER — Other Ambulatory Visit: Payer: Self-pay

## 2021-10-23 ENCOUNTER — Encounter: Payer: Self-pay | Admitting: Family Medicine

## 2021-10-23 ENCOUNTER — Ambulatory Visit (INDEPENDENT_AMBULATORY_CARE_PROVIDER_SITE_OTHER): Payer: BC Managed Care – PPO | Admitting: Family Medicine

## 2021-10-23 VITALS — BP 130/76 | HR 83 | Temp 98.1°F | Resp 16 | Wt 128.8 lb

## 2021-10-23 DIAGNOSIS — Z23 Encounter for immunization: Secondary | ICD-10-CM | POA: Diagnosis not present

## 2021-10-23 DIAGNOSIS — I10 Essential (primary) hypertension: Secondary | ICD-10-CM | POA: Diagnosis not present

## 2021-10-23 MED ORDER — LISINOPRIL 10 MG PO TABS: 10.0000 mg | ORAL_TABLET | Freq: Every day | ORAL | 1 refills | Status: DC

## 2021-10-24 NOTE — Progress Notes (Signed)
? ?Established Patient Office Visit ? ?Subjective:  ?Patient ID: Jesse Wright, male    DOB: [birth year]  Age: 56 y.o. MRN: [id number] ? ?CC:  ?Chief Complaint  ?Patient presents with  ? Follow-up  ? Hypertension  ? Immunizations  ? ? ?HPI ?Jesse Wright presents for follow up of hypertension. Patient denies acute complaints or concerns.  ? ?Past Medical History:  ?Diagnosis Date  ? Allergy   ? Cervical radiculopathy   ? right arm  ? Degenerative disc disease, cervical   ? with foraminal stenosis  ? Emphysema   ? Hearing impairment   ? left ear  ? Hx of lead exposure   ? chronic  ? Hypertrophy of prostate without urinary obstruction and other lower urinary tract symptoms (LUTS)   ? Otitis media   ? Restrictive lung disease   ? Tobacco use disorder   ? Unspecified vitamin D deficiency   ? ? ?Past Surgical History:  ?Procedure Laterality Date  ? FOOT SURGERY    ? ? ?Family History  ?Problem Relation Age of Onset  ? Hypertension Mother   ? Hypertension Father   ? Stroke Father   ? ? ?Social History  ? ?Socioeconomic History  ? Marital status: Widowed  ?  Spouse name: Not on file  ? Number of children: Not on file  ? Years of education: Not on file  ? Highest education level: Not on file  ?Occupational History  ? Not on file  ?Tobacco Use  ? Smoking status: Every Day  ?  Packs/day: 0.50  ?  Years: 23.00  ?  Pack years: 11.50  ?  Types: Cigarettes  ? Smokeless tobacco: Never  ?Substance and Sexual Activity  ? Alcohol use: Not Currently  ? Drug use: Not Currently  ?  Types: Marijuana  ?  Comment: patient's use was sporadic - last use 08/28/16  ? Sexual activity: Not Currently  ?Other Topics Concern  ? Not on file  ?Social History Narrative  ? Not on file  ? ?Social Determinants of Health  ? ?Financial Resource Strain: Not on file  ?Food Insecurity: Not on file  ?Transportation Needs: Not on file  ?Physical Activity: Not on file  ?Stress: Not on file  ?Social Connections: Not on file  ?Intimate Partner Violence: Not on file   ? ? ?ROS ?Review of Systems  ?All other systems reviewed and are negative. ? ?Objective:  ? ?Today's Vitals: BP 130/76   Pulse 83   Temp 98.1 ?F (36.7 ?C) (Oral)   Resp 16   Wt 128 lb 12.8 oz (58.4 kg)   SpO2 97%   BMI 20.17 kg/m?  ? ?Physical Exam ?Vitals and nursing note reviewed.  ?Constitutional:   ?   General: He is not in acute distress. ?Cardiovascular:  ?   Rate and Rhythm: Normal rate and regular rhythm.  ?Pulmonary:  ?   Effort: Pulmonary effort is normal.  ?   Breath sounds: Normal breath sounds.  ?Abdominal:  ?   Palpations: Abdomen is soft.  ?   Tenderness: There is no abdominal tenderness.  ?Musculoskeletal:  ?   Right lower leg: No edema.  ?   Left lower leg: No edema.  ?Neurological:  ?   General: No focal deficit present.  ?   Mental Status: He is alert and oriented to person, place, and time.  ? ? ?Assessment & Plan:  ? ?1. Essential hypertension ?Much improved reading and appears stable with present management. Continue and  monitor ? ?2. Need for shingles vaccine ? ?- Varicella-zoster vaccine IM  ? ? ?Outpatient Encounter Medications as of 10/23/2021  ?Medication Sig  ? dicyclomine (BENTYL) 20 MG tablet Take 1 tablet (20 mg total) by mouth 2 (two) times daily.  ? ibuprofen (ADVIL,MOTRIN) 800 MG tablet Take 400 mg by mouth every 8 (eight) hours as needed for moderate pain.  ? [DISCONTINUED] lisinopril (ZESTRIL) 10 MG tablet Take 1 tablet (10 mg total) by mouth daily.  ? lisinopril (ZESTRIL) 10 MG tablet Take 1 tablet (10 mg total) by mouth daily.  ? ?No facility-administered encounter medications on file as of 10/23/2021.  ? ? ?Follow-up: Return in about 6 months (around 04/25/2022) for follow up.  ? ?Becky Sax, MD ? ?

## 2022-04-19 IMAGING — DX DG ABDOMEN 2V
2 series · 2 of 2 positions shown · non-contrast
Comparison: CT 11/09/2015

CLINICAL DATA: Abdominal pain

EXAM:
ABDOMEN - 2 VIEW

[abdomen supine ap (1 of 2)]
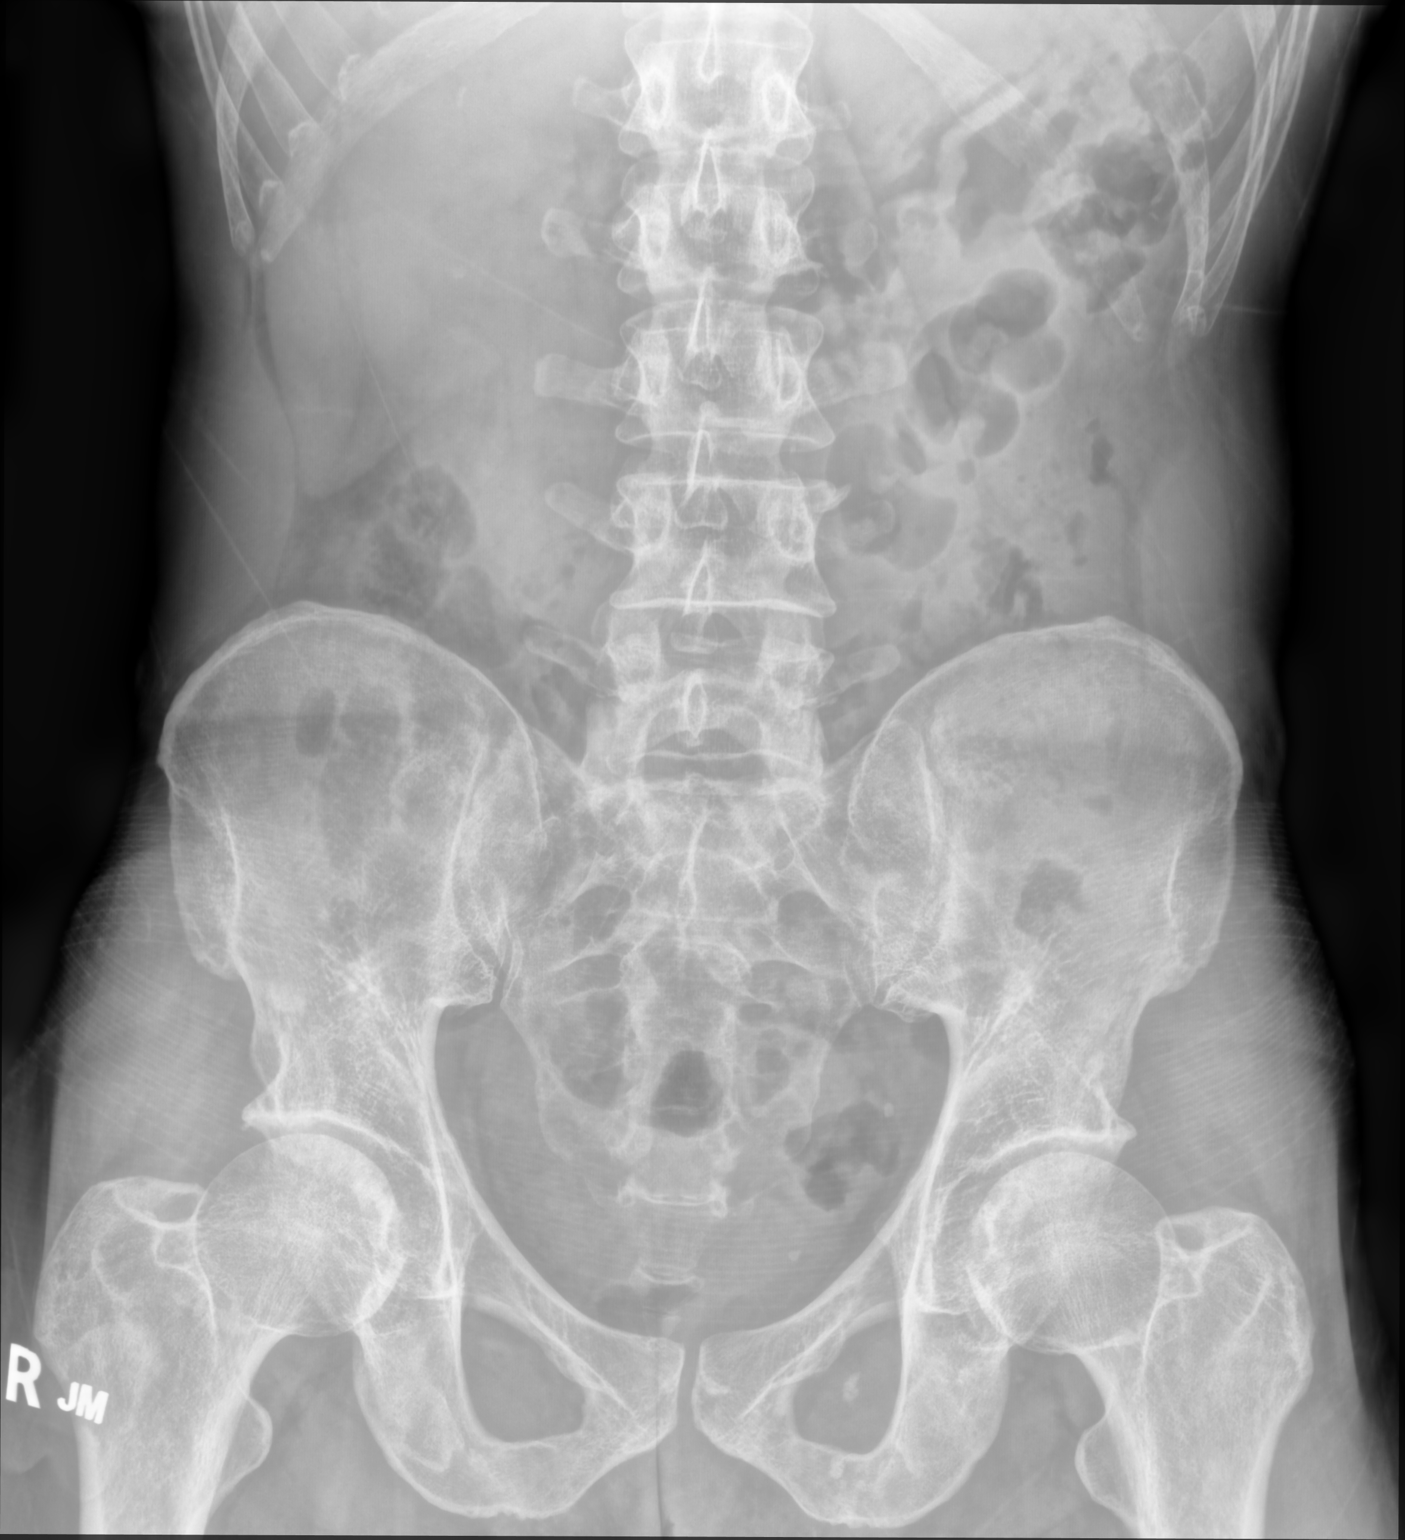

[abdomen supine ap (2 of 2)]
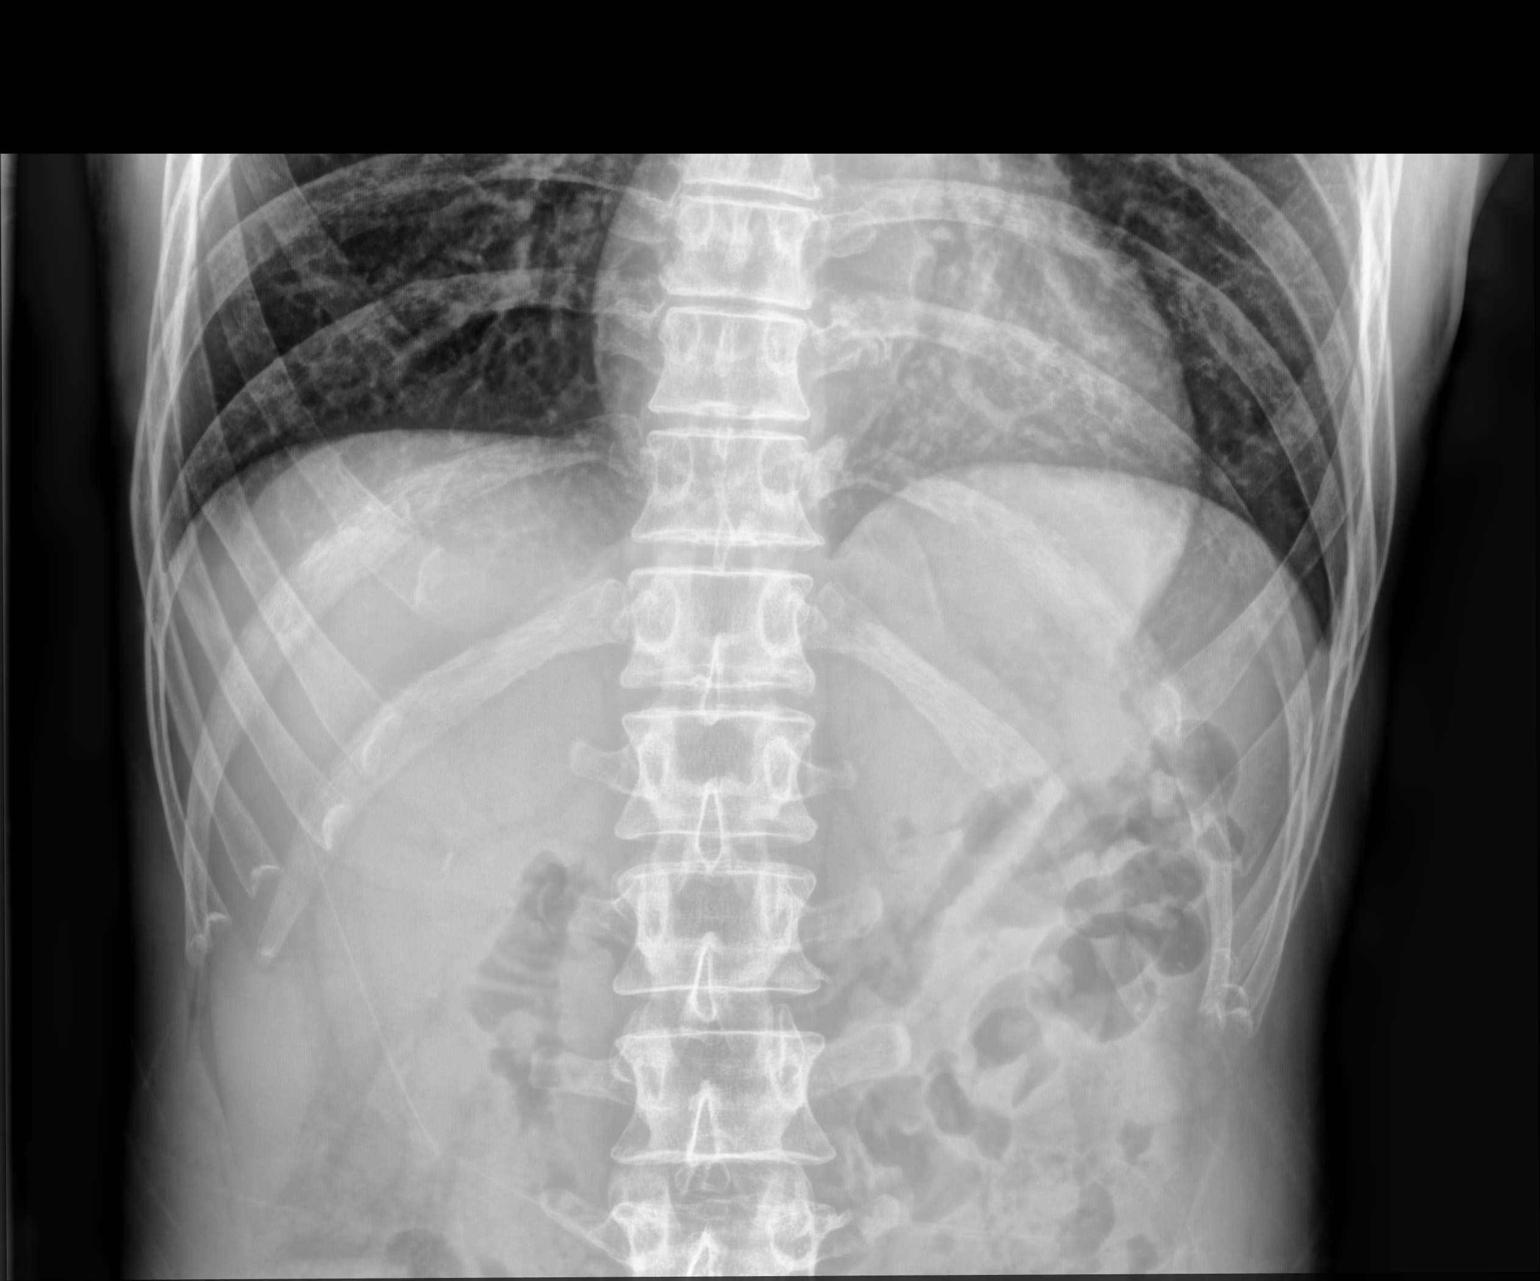

[2 of 2 positions shown; findings below may reference images not displayed]

FINDINGS: Nonobstructive bowel gas pattern. No evidence of free air. Mild
colonic stool burden. No acute osseous abnormality.
IMPRESSION: No evidence of bowel obstruction.  Mild colonic stool burden.

## 2022-05-05 ENCOUNTER — Ambulatory Visit: Payer: BC Managed Care – PPO | Admitting: Family Medicine

## 2022-05-08 DIAGNOSIS — M79671 Pain in right foot: Secondary | ICD-10-CM | POA: Insufficient documentation

## 2022-05-26 ENCOUNTER — Ambulatory Visit: Payer: BC Managed Care – PPO | Admitting: Family Medicine

## 2022-05-26 ENCOUNTER — Encounter: Payer: Self-pay | Admitting: Family Medicine

## 2022-05-26 VITALS — BP 116/73 | HR 73 | Temp 98.2°F | Resp 16 | Wt 128.4 lb

## 2022-05-26 DIAGNOSIS — I10 Essential (primary) hypertension: Secondary | ICD-10-CM

## 2022-05-26 MED ORDER — LISINOPRIL 10 MG PO TABS: 10.0000 mg | ORAL_TABLET | Freq: Every day | ORAL | 1 refills | Status: DC

## 2022-05-27 NOTE — Progress Notes (Signed)
Established Patient Office Visit  Subjective    Patient ID: Jesse Wright, male    DOB: [birth year]  Age: 56 y.o. MRN: [id number]  CC:  Chief Complaint  Patient presents with   Follow-up   Hypertension    HPI Baraa Tubbs Mannes presents for follow up of hypertension. Patient denies acute complaints or concerns.    Outpatient Encounter Medications as of 05/26/2022  Medication Sig   lisinopril (ZESTRIL) 10 MG tablet Take 1 tablet (10 mg total) by mouth daily.   [DISCONTINUED] dicyclomine (BENTYL) 20 MG tablet Take 1 tablet (20 mg total) by mouth 2 (two) times daily.   [DISCONTINUED] ibuprofen (ADVIL,MOTRIN) 800 MG tablet Take 400 mg by mouth every 8 (eight) hours as needed for moderate pain.   [DISCONTINUED] lisinopril (ZESTRIL) 10 MG tablet Take 1 tablet (10 mg total) by mouth daily.   No facility-administered encounter medications on file as of 05/26/2022.    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   Hypertrophy of prostate without urinary obstruction and other lower urinary tract symptoms (LUTS)    Otitis media    Restrictive lung disease    Tobacco use disorder    Unspecified vitamin D deficiency     Past Surgical History:  Procedure Laterality Date   FOOT SURGERY      Family History  Problem Relation Age of Onset   Hypertension Mother    Hypertension Father    Stroke Father     Social History   Socioeconomic History   Marital status: Widowed    Spouse name: Not on file   Number of children: Not on file   Years of education: Not on file   Highest education level: Not on file  Occupational History   Not on file  Tobacco Use   Smoking status: Every Day    Packs/day: 0.50    Years: 23.00    Total pack years: 11.50    Types: Cigarettes   Smokeless tobacco: Never   Substance and Sexual Activity   Alcohol use: Not Currently   Drug use: Not Currently    Types: Marijuana    Comment: patient's use was sporadic - last use 08/28/16   Sexual activity: Not Currently  Other Topics Concern   Not on file  Social History Narrative   Not on file   Social Determinants of Health   Financial Resource Strain: Not on file  Food Insecurity: Not on file  Transportation Needs: Not on file  Physical Activity: Not on file  Stress: Not on file  Social Connections: Not on file  Intimate Partner Violence: Not on file    Review of Systems  All other systems reviewed and are negative.       Objective    BP 116/73   Pulse 73   Temp 98.2 F (36.8 C) (Oral)   Resp 16   Wt 128 lb 6.4 oz (58.2 kg)   SpO2 99%   BMI 20.11 kg/m   Physical Exam Vitals and nursing note reviewed.  Constitutional:      General: He is not in acute distress. Cardiovascular:     Rate and Rhythm: Normal rate and regular rhythm.  Pulmonary:     Effort:  Pulmonary effort is normal.     Breath sounds: Normal breath sounds.  Abdominal:     Palpations: Abdomen is soft.     Tenderness: There is no abdominal tenderness.  Musculoskeletal:     Right lower leg: No edema.     Left lower leg: No edema.  Neurological:     General: No focal deficit present.     Mental Status: He is alert and oriented to person, place, and time.         Assessment & Plan:   1. Essential hypertension Appears stable. Continue meds refilled    Return in about 6 months (around 11/25/2022) for physical, follow up.   Becky Sax, MD

## 2022-10-06 ENCOUNTER — Ambulatory Visit: Payer: Self-pay

## 2022-10-06 NOTE — Progress Notes (Unsigned)
Virtual Visit via Telephone Note  I connected with [place], on 10/07/2022 at 9:45 AM by telephone and verified that I am speaking with the correct person using two identifiers.  Consent: I discussed the limitations, risks, security and privacy concerns of performing an evaluation and management service by telephone and the availability of in person appointments. I also discussed with the patient that there may be a patient responsible charge related to this service. The patient expressed understanding and agreed to proceed.   Location of Patient: Home  Location of Provider: [facility] participating in Telemedicine visit: [place], NP Elmon Else, CMA   History of Present Illness: Jesse Wright is a 57 y.o. male who presents for Covid positive.   10/06/2022 per triage RN note: Chief Complaint: COVID pos Symptoms: cough, congestion, chills, HA, fatigue  Frequency: Saturday night  Pertinent Negatives: Patient denies fever or SOB Disposition: []$ ED /[]$ Urgent Care (no appt availability in office) / [x]$ Appointment(In office/virtual)/ []$  [place] [facility]/ []$ Home Care/ []$ Refused Recommended Disposition /[]$ [place] [facility]/ []$  Follow-up with PCP Additional Notes: pt is requesting Paxlovid, advised having to do visit with provider to be seen, pt wanting to do VV rather than come in office so called and spoke with Corey Skains, asked if OV can be changed to [place] for pt tomorrow. Dr. Redmond Pulling doesn't do VV so asked pt if he was ok with doing VV at 0920 with Tavin Vernet, NP, pt agreed. Tollie Pizza, [facility] Teams message to change appt to VV and gave pt care advice, pt verbalized understanding.      Summary: positive covid    Pt called saying he tested positive yesterday for covid.  Chills, headache, fatigue, congestion.  He would like medication to be sent to [place]  CB#  [phone]          Reason for Disposition   [1] COVID-19 diagnosed by positive lab test (e.g., PCR, rapid self-test kit) AND [2] mild symptoms (e.g., cough, fever, others) AND [id number] no complications or SOB  Answer Assessment - Initial Assessment Questions 1. COVID-19 DIAGNOSIS: "How do you know that you have COVID?" (e.g., positive lab test or self-test, diagnosed by doctor or NP/PA, symptoms after exposure).     Home test yesterday  3. ONSET: "When did the COVID-19 symptoms start?"      Saturday  5. COUGH: "Do you have a cough?" If Yes, ask: "How bad is the cough?"       Yes  6. FEVER: "Do you have a fever?" If Yes, ask: "What is your temperature, how was it measured, and when did it start?"     no 7. RESPIRATORY STATUS: "Describe your breathing?" (e.g., normal; shortness of breath, wheezing, unable to speak)      no 9. OTHER SYMPTOMS: "Do you have any other symptoms?"  (e.g., chills, fatigue, headache, loss of smell or taste, muscle pain, sore throat)     Chills, HA, fatigue, congestion and cough 10. HIGH RISK DISEASE: "Do you have any chronic medical problems?" (e.g., asthma, heart or lung disease, weak immune system, obesity, etc.)       no   Today's visit 10/07/2022: Today patient states "I am feeling better". States "I'm not feeling as bad as I thought I would." Reports he still has intermittent headache which he is taking Tylenol to help and stuffy nose. He denies red flag symptoms. Reports he took 2 home Covid tests  two days ago which were positive. Reports he is not interested in flu and RSV testing at this time. No further issues/concerns for today.   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   Hypertrophy of prostate without urinary obstruction and other lower urinary tract symptoms (LUTS)    Otitis media    Restrictive lung  disease    Tobacco use disorder    Unspecified vitamin D deficiency    Allergies  Allergen Reactions   Penicillins Other (See Comments) and Anaphylaxis    "been so long ago...dizzy, lightheaded" Has patient had a PCN reaction causing immediate rash, facial/tongue/throat swelling, SOB or lightheadedness with hypotension: YES Has patient had a PCN reaction causing severe rash involving mucus membranes or skin necrosis:NO Has patient had a PCN reaction that required hospitalization NO Has patient had a PCN reaction occurring within the last 10 years: NO If all of the above answers are "NO", then may proceed with Cephalosporin use. Other reaction(s): Dizziness   Telithromycin Other (See Comments)    Patient doesn't recall ever taking this medication; has no idea reaction Other reaction(s): Other Patient doesn't recall ever taking this medication; has no idea reaction    Current Outpatient Medications on File Prior to Visit  Medication Sig Dispense Refill   lisinopril (ZESTRIL) 10 MG tablet Take 1 tablet (10 mg total) by mouth daily. 90 tablet 1   No current facility-administered medications on file prior to visit.    Observations/Objective: Alert and oriented x 3. Not in acute distress. Physical examination not completed as this is a telemedicine visit.  Assessment and Plan: 1. COVID - Paxlovid as prescribed. Counseled on medication adherence.  - Patient was given clear instructions to go to Emergency Department or return to medical center if symptoms don't improve, worsen, or new problems develop.The patient verbalized understanding. - nirmatrelvir & ritonavir (PAXLOVID) 20 x 150 MG & 10 x 100MG TBPK; Take 1 tablet by mouth in the morning and at bedtime for 5 days.  Dispense: 10 tablet; Refill: 0  Follow Up Instructions:   Patient was given clear instructions to go to Emergency Department or return to medical center if symptoms don't improve, worsen, or new problems develop.The  patient verbalized understanding.  I discussed the assessment and treatment plan with the patient. The patient was provided an opportunity to ask questions and all were answered. The patient agreed with the plan and demonstrated an understanding of the instructions.   The patient was advised to call back or seek an in-person evaluation if the symptoms worsen or if the condition fails to improve as anticipated.     I provided 5 minutes total of non-face-to-face time during this encounter.   Camillia Herter, NP  [facility] [place], [place] 10/07/2022, 9:45 AM

## 2022-10-06 NOTE — Telephone Encounter (Signed)
  Chief Complaint: COVID pos Symptoms: cough, congestion, chills, HA, fatigue  Frequency: Saturday night  Pertinent Negatives: Patient denies fever or SOB Disposition: [] ED /[] Urgent Care (no appt availability in office) / [x] Appointment(In office/virtual)/ []  [place] [facility]/ [] Home Care/ [] Refused Recommended Disposition /[] [place] [facility]/ []  Follow-up with PCP Additional Notes: pt is requesting Paxlovid, advised having to do visit with provider to be seen, pt wanting to do VV rather than come in office so called and spoke with Corey Skains, asked if OV can be changed to [place] for pt tomorrow. Dr. Redmond Pulling doesn't do VV so asked pt if he was ok with doing VV at 0920 with Amy, NP, pt agreed. Tollie Pizza, [facility] Teams message to change appt to VV and gave pt care advice, pt verbalized understanding.  Summary: positive covid   Pt called saying he tested positive yesterday for covid.  Chills, headache, fatigue, congestion.  He would like medication to be sent to [facility]  CB#  [phone]         Reason for Disposition  [1] COVID-19 diagnosed by positive lab test (e.g., PCR, rapid self-test kit) AND [2] mild symptoms (e.g., cough, fever, others) AND [4] no complications or SOB  Answer Assessment - Initial Assessment Questions 1. COVID-19 DIAGNOSIS: "How do you know that you have COVID?" (e.g., positive lab test or self-test, diagnosed by doctor or NP/PA, symptoms after exposure).     Home test yesterday  3. ONSET: "When did the COVID-19 symptoms start?"      Saturday  5. COUGH: "Do you have a cough?" If Yes, ask: "How bad is the cough?"       Yes  6. FEVER: "Do you have a fever?" If Yes, ask: "What is your temperature, how was it measured, and when did it start?"     no 7. RESPIRATORY STATUS: "Describe your breathing?" (e.g., normal; shortness of breath, wheezing, unable to speak)      no 9. OTHER SYMPTOMS: "Do you have any other symptoms?"  (e.g., chills, fatigue, headache,  loss of smell or taste, muscle pain, sore throat)     Chills, HA, fatigue, congestion and cough 10. HIGH RISK DISEASE: "Do you have any chronic medical problems?" (e.g., asthma, heart or lung disease, weak immune system, obesity, etc.)       no  Protocols used: Coronavirus (COVID-19) Diagnosed or Suspected-A-AH

## 2022-10-07 ENCOUNTER — Telehealth (INDEPENDENT_AMBULATORY_CARE_PROVIDER_SITE_OTHER): Payer: BC Managed Care – PPO | Admitting: Family

## 2022-10-07 ENCOUNTER — Other Ambulatory Visit: Payer: Self-pay | Admitting: Family

## 2022-10-07 DIAGNOSIS — U071 COVID-19: Secondary | ICD-10-CM

## 2022-10-07 MED ORDER — NIRMATRELVIR&RITONAVIR 300/100 20 X 150 MG & 10 X 100MG PO TBPK: 1.0000 | ORAL_TABLET | Freq: Two times a day (BID) | ORAL | 0 refills | Status: DC

## 2022-10-07 MED ORDER — PAXLOVID (300/100) 20 X 150 MG & 10 X 100MG PO TBPK: 2.0000 | ORAL_TABLET | Freq: Two times a day (BID) | ORAL | 0 refills | Status: AC

## 2022-10-07 MED ORDER — PAXLOVID (300/100) 20 X 150 MG & 10 X 100MG PO TBPK: 1.0000 | ORAL_TABLET | Freq: Two times a day (BID) | ORAL | 0 refills | Status: DC

## 2022-10-07 NOTE — Telephone Encounter (Signed)
Order complete. I consulted with Dorna Mai, MD.

## 2022-10-07 NOTE — Telephone Encounter (Signed)
Please call patient's pharmacy to confirm that they received updated prescription. Thank you.

## 2022-12-02 ENCOUNTER — Encounter: Payer: BC Managed Care – PPO | Admitting: Family Medicine

## 2022-12-06 ENCOUNTER — Other Ambulatory Visit: Payer: Self-pay | Admitting: Family Medicine

## 2022-12-08 NOTE — Telephone Encounter (Signed)
Requested Prescriptions  Pending Prescriptions Disp Refills   lisinopril (ZESTRIL) 10 MG tablet [Pharmacy Med Name: LISINOPRIL 10 MG TABLET] 90 tablet 0    Sig: TAKE 1 TABLET BY MOUTH EVERY DAY     Cardiovascular:  ACE Inhibitors Failed - 12/06/2022  8:55 AM      Failed - Cr in normal range and within 180 days    Creatinine, Ser  Date Value Ref Range Status  11/09/2015 0.86 0.61 - 1.24 mg/dL Final         Failed - K in normal range and within 180 days    Potassium  Date Value Ref Range Status  11/09/2015 3.5 3.5 - 5.1 mmol/L Final         Passed - Patient is not pregnant      Passed - Last BP in normal range    BP Readings from Last 1 Encounters:  05/26/22 116/73         Passed - Valid encounter within last 6 months    Recent Outpatient Visits           2 months ago COVID   [facility] [place], NP   6 months ago Essential hypertension   [facility], MD   1 year ago Essential hypertension   [facility], MD   1 year ago Essential hypertension   [facility], MD   7 years ago Vertigo   [facility] at [facility], MD

## 2023-03-16 ENCOUNTER — Other Ambulatory Visit: Payer: Self-pay | Admitting: Family Medicine
